# Patient Record
Sex: Male | Born: 1985 | Hispanic: No
Health system: Southern US, Academic
[De-identification: ages and names within clinical notes are randomized; demographics above are authoritative.]

## PROBLEM LIST (undated history)

## (undated) DIAGNOSIS — N200 Calculus of kidney: Secondary | ICD-10-CM

## (undated) HISTORY — DX: Calculus of kidney: N20.0

---

## 2019-12-29 ENCOUNTER — Emergency Department (HOSPITAL_COMMUNITY)
Admission: EM | Admit: 2019-12-29 | Discharge: 2019-12-30 | Disposition: A | Discharge status: Home / Self Care | Payer: PRIVATE HEALTH INSURANCE | Attending: Emergency Medicine | Admitting: Emergency Medicine

## 2019-12-29 ENCOUNTER — Encounter (HOSPITAL_COMMUNITY): Payer: Self-pay | Admitting: Emergency Medicine

## 2019-12-29 ENCOUNTER — Other Ambulatory Visit: Payer: Self-pay

## 2019-12-29 DIAGNOSIS — N201 Calculus of ureter: Secondary | ICD-10-CM | POA: Insufficient documentation

## 2019-12-29 DIAGNOSIS — R1030 Lower abdominal pain, unspecified: Secondary | ICD-10-CM | POA: Diagnosis present

## 2019-12-29 LAB — URINALYSIS, ROUTINE W REFLEX MICROSCOPIC
Bilirubin Urine: NEGATIVE
Glucose, UA: NEGATIVE mg/dL
Ketones, ur: NEGATIVE mg/dL
Leukocytes,Ua: NEGATIVE
Nitrite: NEGATIVE
Protein, ur: NEGATIVE mg/dL
Specific Gravity, Urine: 1.023 (ref 1.005–1.030)
pH: 5 (ref 5.0–8.0)

## 2019-12-29 MED ORDER — HYDROMORPHONE HCL 1 MG/ML IJ SOLN
1.0000 mg | Freq: Once | INTRAMUSCULAR | Status: AC
  Administered 2019-12-30: 1 mg via INTRAVENOUS
  Filled 2019-12-29: qty 1

## 2019-12-29 MED ORDER — ONDANSETRON HCL 4 MG/2ML IJ SOLN
4.0000 mg | Freq: Once | INTRAMUSCULAR | Status: AC
  Administered 2019-12-30: 4 mg via INTRAVENOUS
  Filled 2019-12-29: qty 2

## 2019-12-29 MED ORDER — KETOROLAC TROMETHAMINE 30 MG/ML IJ SOLN
30.0000 mg | Freq: Once | INTRAMUSCULAR | Status: AC
  Administered 2019-12-30: 30 mg via INTRAVENOUS
  Filled 2019-12-29: qty 1

## 2019-12-29 NOTE — ED Triage Notes (Signed)
Pt reports left sided flank pain with hx of kidney stones. Pt reports hematuria.

## 2019-12-29 NOTE — ED Provider Notes (Signed)
[facility]-EMERGENCY DEPT Provider Note   CSN: [id number] Arrival date & time: 12/29/19  2239     History Chief Complaint  Patient presents with  . Flank Pain    Jamie Chase is a 34 y.o. male.  Patient presents to the emergency department with a chief complaint of right-sided flank pain.  He states the pain started this morning.  He states that it feels similar to prior kidney stones.  He rates his pain as a 10 out of 10.  He reports associated hematuria and dysuria.  He has tried taking tramadol with no relief of his symptoms.  He reports having had 10 lithotripsies in the past.  He reports associated nausea, but has not had vomiting.  He denies any other associated symptoms.  The history is provided by the patient. No language interpreter was used.       Past Medical History:  Diagnosis Date  . Kidney stones     There are no problems to display for this patient.   History reviewed. No pertinent surgical history.     No family history on file.  Social History   Tobacco Use  . Smoking status: Never Smoker  . Smokeless tobacco: Never Used  Substance Use Topics  . Alcohol use: Never  . Drug use: Yes    Types: Marijuana    Home Medications Prior to Admission medications   Not on File    Allergies    Patient has no known allergies.  Review of Systems   Review of Systems  All other systems reviewed and are negative.   Physical Exam Updated Vital Signs BP (!) 157/114 (BP Location: Right Arm)   Pulse 95   Temp 97.9 F (36.6 C) (Oral)   Resp 18   Ht 6\' 1"  (1.854 m)   Wt (!) 190.1 kg   SpO2 96%   BMI 55.28 kg/m   Physical Exam Vitals and nursing note reviewed.  Constitutional:      Appearance: He is well-developed.     Comments: Morbidly obese  HENT:     Head: Normocephalic and atraumatic.  Eyes:     Conjunctiva/sclera: Conjunctivae normal.  Cardiovascular:     Rate and Rhythm: Normal rate and regular rhythm.      Heart sounds: No murmur heard.   Pulmonary:     Effort: Pulmonary effort is normal. No respiratory distress.     Breath sounds: Normal breath sounds.  Abdominal:     Palpations: Abdomen is soft.     Tenderness: There is no abdominal tenderness.     Comments: No focal abdominal tenderness, no RLQ tenderness or pain at McBurney's point, no RUQ tenderness or Murphy's sign, no left-sided abdominal tenderness, no fluid wave, or signs of peritonitis   Musculoskeletal:        General: Normal range of motion.     Cervical back: Neck supple.  Skin:    General: Skin is warm and dry.  Neurological:     Mental Status: He is alert and oriented to person, place, and time.  Psychiatric:        Mood and Affect: Mood normal.        Behavior: Behavior normal.     ED Results / Procedures / Treatments   Labs (all labs ordered are listed, but only abnormal results are displayed) Labs Reviewed  URINALYSIS, ROUTINE W REFLEX MICROSCOPIC  CBC WITH DIFFERENTIAL/PLATELET  COMPREHENSIVE METABOLIC PANEL    EKG None  Radiology No results found.  Procedures Procedures (including critical care time)  Medications Ordered in ED Medications  ketorolac (TORADOL) 30 MG/ML injection 30 mg (has no administration in time range)  HYDROmorphone (DILAUDID) injection 1 mg (has no administration in time range)  ondansetron (ZOFRAN) injection 4 mg (has no administration in time range)    ED Course  I have reviewed the triage vital signs and the nursing notes.  Pertinent labs & imaging results that were available during my care of the patient were reviewed by me and considered in my medical decision making (see chart for details).    MDM Rules/Calculators/A&P                          Patient with right-sided flank pain.  He has an extensive history of kidney stones.  He states this feels similar.  His symptoms started earlier this morning.  His pain is 10 out of 10.    We will treat pain with Toradol and  Dilaudid.  Laboratory work-up notable for normal creatinine, no leukocytosis.  CT shows 5 mm obstructing renal stone in the mid right ureter with significant right-sided hydronephrosis and hydroureter.  Urinalysis shows moderate hemoglobin, 21-50 RBC and 6-10 WBC with rare bacteria.  I have a lower suspicion for infected stone given patient's normal vitals and reassuring labs.  I will send urine for culture.  Of note, patient is taking amoxicillin for pharyngitis.  We will send Norco to pharmacy.  Patient is scheduled to see his urology provider on Monday.  Return precautions discussed.  Final Clinical Impression(s) / ED Diagnoses Final diagnoses:  Ureterolithiasis    Rx / DC Orders ED Discharge Orders         Ordered    HYDROcodone-acetaminophen (NORCO/VICODIN) 5-325 MG tablet  Every 6 hours PRN     Discontinue  Reprint     12/30/19 0159    ondansetron (ZOFRAN ODT) 4 MG disintegrating tablet  Every 8 hours PRN     Discontinue  Reprint     12/30/19 0159           Roxy Horseman, PA-C 12/30/19 0203    Ward, Layla Maw, DO 12/30/19 0206

## 2019-12-30 ENCOUNTER — Emergency Department (HOSPITAL_COMMUNITY): Payer: PRIVATE HEALTH INSURANCE

## 2019-12-30 DIAGNOSIS — N201 Calculus of ureter: Secondary | ICD-10-CM | POA: Diagnosis not present

## 2019-12-30 LAB — COMPREHENSIVE METABOLIC PANEL
ALT: 32 U/L (ref 0–44)
AST: 32 U/L (ref 15–41)
Albumin: 4.2 g/dL (ref 3.5–5.0)
Alkaline Phosphatase: 54 U/L (ref 38–126)
Anion gap: 11 (ref 5–15)
BUN: 19 mg/dL (ref 6–20)
CO2: 24 mmol/L (ref 22–32)
Calcium: 9.2 mg/dL (ref 8.9–10.3)
Chloride: 107 mmol/L (ref 98–111)
Creatinine, Ser: 1.26 mg/dL — ABNORMAL HIGH (ref 0.61–1.24)
GFR calc Af Amer: >60 mL/min (ref >60)
GFR calc non Af Amer: >60 mL/min (ref >60)
Glucose, Bld: 124 mg/dL — ABNORMAL HIGH (ref 70–99)
Potassium: 4.1 mmol/L (ref 3.5–5.1)
Sodium: 142 mmol/L (ref 135–145)
Total Bilirubin: 0.7 mg/dL (ref 0.3–1.2)
Total Protein: 8.1 g/dL (ref 6.5–8.1)

## 2019-12-30 LAB — CBC WITH DIFFERENTIAL/PLATELET
Abs Immature Granulocytes: 0.03 10*3/uL (ref 0.00–0.07)
Basophils Absolute: 0 10*3/uL (ref 0.0–0.1)
Basophils Relative: 0 %
Eosinophils Absolute: 0 10*3/uL (ref 0.0–0.5)
Eosinophils Relative: 1 %
HCT: 49.6 % (ref 39.0–52.0)
Hemoglobin: 15.9 g/dL (ref 13.0–17.0)
Immature Granulocytes: 0 %
Lymphocytes Relative: 22 %
Lymphs Abs: 1.8 10*3/uL (ref 0.7–4.0)
MCH: 28.9 pg (ref 26.0–34.0)
MCHC: 32.1 g/dL (ref 30.0–36.0)
MCV: 90 fL (ref 80.0–100.0)
Monocytes Absolute: 0.7 10*3/uL (ref 0.1–1.0)
Monocytes Relative: 9 %
Neutro Abs: 5.6 10*3/uL (ref 1.7–7.7)
Neutrophils Relative %: 68 %
Platelets: 319 10*3/uL (ref 150–400)
RBC: 5.51 MIL/uL (ref 4.22–5.81)
RDW: 14.3 % (ref 11.5–15.5)
WBC: 8.2 10*3/uL (ref 4.0–10.5)
nRBC: 0 % (ref 0.0–0.2)

## 2019-12-30 MED ORDER — ONDANSETRON 4 MG PO TBDP
4.0000 mg | ORAL_TABLET | Freq: Three times a day (TID) | ORAL | 0 refills | Status: AC | PRN
Start: 2019-12-30 — End: ?

## 2019-12-30 MED ORDER — HYDROMORPHONE HCL 1 MG/ML IJ SOLN
1.0000 mg | Freq: Once | INTRAMUSCULAR | Status: AC
  Administered 2019-12-30: 1 mg via INTRAVENOUS
  Filled 2019-12-30: qty 1

## 2019-12-30 MED ORDER — HYDROCODONE-ACETAMINOPHEN 5-325 MG PO TABS: 1.0000 | ORAL_TABLET | Freq: Four times a day (QID) | ORAL | 0 refills | Status: DC | PRN

## 2019-12-30 MED ORDER — ONDANSETRON 4 MG PO TBDP
4.0000 mg | ORAL_TABLET | Freq: Three times a day (TID) | ORAL | 0 refills | Status: DC | PRN
Start: 2019-12-30 — End: 2019-12-30

## 2019-12-30 MED ORDER — HYDROCODONE-ACETAMINOPHEN 5-325 MG PO TABS: 1.0000 | ORAL_TABLET | Freq: Four times a day (QID) | ORAL | 0 refills | Status: AC | PRN

## 2019-12-30 NOTE — Discharge Instructions (Addendum)
Continue to drink plenty of fluids.  Please follow-up with your urologist.  Please take pain medication as prescribed.  If you run a fever, or have worsening pain, or uncontrollable vomiting, please return to the emergency department.

## 2021-04-11 ENCOUNTER — Other Ambulatory Visit: Payer: Self-pay

## 2021-04-11 ENCOUNTER — Emergency Department (HOSPITAL_COMMUNITY)
Admission: RE | Admit: 2021-04-11 | Discharge: 2021-04-11 | Disposition: A | Discharge status: Home / Self Care | Payer: Self-pay | Source: Ambulatory Visit

## 2021-04-11 ENCOUNTER — Emergency Department
Admission: EM | Admit: 2021-04-11 | Discharge: 2021-04-12 | Disposition: A | Discharge status: Home / Self Care | Payer: Self-pay | Attending: Nurse Practitioner | Admitting: Nurse Practitioner

## 2021-04-11 DIAGNOSIS — L0390 Cellulitis, unspecified: Secondary | ICD-10-CM | POA: Insufficient documentation

## 2021-04-11 DIAGNOSIS — L03116 Cellulitis of left lower limb: Secondary | ICD-10-CM

## 2021-04-11 DIAGNOSIS — L03115 Cellulitis of right lower limb: Secondary | ICD-10-CM

## 2021-04-11 LAB — CBC WITH DIFF
BASOPHIL #: 0 10*3/uL (ref 0.00–0.20)
BASOPHIL %: 1 % (ref 0–2)
EOSINOPHIL #: 0.1 10*3/uL (ref 0.00–0.60)
EOSINOPHIL %: 3 % (ref 0–5)
HCT: 44.3 % (ref 36.0–46.0)
HGB: 14.7 g/dL (ref 13.9–16.3)
LYMPHOCYTE #: 1.4 10*3/uL (ref 1.10–3.80)
LYMPHOCYTE %: 25 % (ref 19–46)
MCH: 27.5 pg (ref 25.4–34.0)
MCHC: 33.1 g/dL (ref 30.0–37.0)
MCV: 83.1 fL (ref 80.0–100.0)
MONOCYTE #: 0.5 10*3/uL (ref 0.10–0.80)
MONOCYTE %: 9 % (ref 4–12)
MPV: 7.1 fL — ABNORMAL LOW (ref 7.5–11.5)
NEUTROPHIL #: 3.6 10*3/uL (ref 1.80–7.50)
NEUTROPHIL %: 64 % (ref 41–69)
PLATELETS: 310 10*3/uL (ref 130–400)
RBC: 5.33 10*6/uL (ref 4.30–5.90)
RDW: 14.4 % — ABNORMAL HIGH (ref 11.5–14.0)
WBC: 5.6 10*3/uL (ref 4.5–11.5)

## 2021-04-11 LAB — COMPREHENSIVE METABOLIC PANEL, NON-FASTING
ALBUMIN/GLOBULIN RATIO: 1.4 — ABNORMAL LOW (ref 1.5–2.5)
ALBUMIN: 4.2 g/dL (ref 3.5–5.0)
ALKALINE PHOSPHATASE: 63 U/L (ref 38–126)
ALT (SGPT): 22 U/L (ref <50)
ANION GAP: 11 mmol/L (ref 5–19)
AST (SGOT): 29 U/L (ref 17–59)
BILIRUBIN TOTAL: 0.6 mg/dL (ref 0.2–1.3)
BUN/CREA RATIO: 13 (ref 6–20)
BUN: 13 mg/dL (ref 9–20)
CALCIUM: 8.9 mg/dL (ref 8.4–10.2)
CHLORIDE: 105 mmol/L (ref 98–107)
CO2 TOTAL: 25 mmol/L (ref 22–30)
CREATININE: 0.98 mg/dL (ref 0.66–1.20)
ESTIMATED GFR: >60 mL/min/{1.73_m2} (ref >60)
GLUCOSE: 84 mg/dL (ref 74–106)
POTASSIUM: 4 mmol/L (ref 3.5–5.1)
PROTEIN TOTAL: 7.3 g/dL (ref 6.3–8.2)
SODIUM: 141 mmol/L (ref 137–145)

## 2021-04-11 LAB — TROPONIN-I: TROPONIN I: <0.01 ng/mL (ref 0.00–0.03)

## 2021-04-11 LAB — D-DIMER: D-DIMER: 0.19 mg/L (ref <0.53)

## 2021-04-11 LAB — NT-PROBNP: NT-PROBNP: 106 pg/mL

## 2021-04-11 MED ORDER — CEFAZOLIN 1 GRAM/50 ML IN DEXTROSE (ISO-OSMOTIC) INTRAVENOUS PIGGYBACK
1.0000 g | INJECTION | Freq: Three times a day (TID) | INTRAVENOUS | Status: DC
Start: 2021-04-12 — End: 2021-04-11

## 2021-04-11 MED ORDER — CEPHALEXIN 500 MG CAPSULE
500.0000 mg | ORAL_CAPSULE | Freq: Four times a day (QID) | ORAL | 0 refills | Status: AC
Start: 2021-04-11 — End: 2021-04-21

## 2021-04-11 MED ORDER — CEFAZOLIN 1 GRAM/50 ML IN DEXTROSE (ISO-OSMOTIC) INTRAVENOUS PIGGYBACK
1.0000 g | INJECTION | INTRAVENOUS | Status: AC
Start: 2021-04-12 — End: 2021-04-12
  Administered 2021-04-12: 0 g via INTRAVENOUS
  Administered 2021-04-12: 1 g via INTRAVENOUS

## 2021-04-11 NOTE — ED Provider Notes (Signed)
[facility]  Emergency Department      Name: Nathan Johns  Age and Gender: 35 y.o. male  Date of Birth: 02/08/1986  Date of Service: 04/11/2021   MRN: [id number]  PCP: No primary care provider on file.    Chief Complaint   Patient presents with   . Leg Swelling     BLE redness and swelling       HPI:  Patient presents to the emergency department chief complaint of bilateral lower leg swelling and redness.  Patient states he noticed this earlier today.  Patient's feels as if his legs are warm to touch and are definitely painful.  Patient denies any shortness of breath.  Patient denies any chest pain.  Patient denies any activity intolerance.    Nathan Johns is a 35 y.o. male presenting with bilateral lower leg redness and swelling    ROS:  All systems reviewed and are negative, unless stated in the HPI.      Below pertinent information reviewed with patient and/or EMR:  No past medical history on file.   Cannot display prior to admission medications because the patient has not been admitted in this contact.        No Known Allergies  No past surgical history on file.  Family Medical History:    None            Objective:  ED Triage Vitals [04/11/21 2228]   BP (Non-Invasive) 125/82   Heart Rate 96   Respiratory Rate 18   Temperature 36.8 C (98.2 F)   SpO2 100 %   Weight (!) 166 kg (365 lb)   Height 1.854 m ('6\' 1"'$ )     Nursing notes and vital signs reviewed.    Constitutional:  No acute distress.  Alert and oriented to person, place, and time.   HENT:   Head: Normocephalic and atraumatic.   Mouth/Throat: Oropharynx is clear and moist.   Eyes: EOMI, PERRL   Ears: TM's normal, ear canal normal  Neck: Trachea midline. Neck supple.  Cardiovascular: RRR, No murmurs, rubs or gallops. Intact distal pulses.  Pulmonary/Chest: BS equal bilaterally. No respiratory distress. No wheezes, rales or chest tenderness.  No tachypnea, retractions or accessory muscle use.  Abdominal: BS +. Abdomen soft, no tenderness, rebound  or guarding.  Back: No midline spinal tenderness, no paraspinal tenderness, no CVA tenderness.     Musculoskeletal:   Strength equal all extremities.  Extremities N/V intact  Skin: warm and dry.  There is erythema and warmth to touch of the bilateral lower legs from mid tibia down to ankles.  There is also bilateral edema noted but no pitting edema.  Psychiatric: normal mood and affect. Behavior is normal.   Neurological: Patient keenly alert and responsive, CN II-XII grossly intact, moving all extremities equally and fully, normal gait    Labs:   Labs Reviewed   COMPREHENSIVE METABOLIC PANEL, NON-FASTING - Abnormal; Notable for the following components:       Result Value    ALBUMIN/GLOBULIN RATIO 1.4 (*)     All other components within normal limits    Narrative:     Estimated Glomerular Filtration Rate (eGFR) is calculated using the CKD-EPI (2021) equation, intended for patients 91 years of age and older. If gender is not documented or "unknown", there will be no eGFR calculation.   CBC WITH DIFF - Abnormal; Notable for the following components:    RDW 14.4 (*)     MPV 7.1 (*)  All other components within normal limits   D-DIMER - Normal   TROPONIN-I - Normal   CBC/DIFF    Narrative:     The following orders were created for panel order CBC/DIFF.  Procedure                               Abnormality         Status                     ---------                               -----------         ------                     CBC WITH DDUK[[id number]]                Abnormal            Final result                 Please view results for these tests on the individual orders.   NT-PROBNP       Imaging:  XR AP MOBILE CHEST   Final Result by Edi, Radresults In (10/29 2300)   NO ACUTE FINDINGS.         Radiologist location ID: [id number]             EKG:  EKG shows a sinus rhythm of 93.  PR interval is 124.  QT is 354.  The rhythm is regular there is normal ST segment.  There is no ectopic beats.  EKG is reviewed by ED  attending.    MDM/Course:  Patient remained stable the entire time in the emergency department.  Patient's chest x-ray was negative per the radiologist interpretation.  Patient is troponin D-dimer and BNP were also within normal limits.  Patient's EKG is unremarkable.  The patient will be given 1 g of Ancef IV followed by Keflex.  Patient advised to follow up family doctor this week for further evaluation return if symptoms worse or change in where a new symptoms develop.  Patient verbalized understanding agree with treatment plan.           Clinical Impression:     Encounter Diagnosis   Name Primary?   . Cellulitis, unspecified cellulitis site Yes       Medications given:  Medications Administered in the ED   ceFAZolin (ANCEF) 1 g in iso-osmotic 50 mL premix IVPB (has no administration in time range)           Disposition: Discharged       Current Discharge Medication List      START taking these medications.      Details   cephalexin 500 mg Capsule  Commonly known as: KEFLEX   500 mg, Oral, 4 TIMES DAILY  Qty: 40 Capsule  Refills: 0            Follow up:      No follow-up provider specified.    Parts of this patients chart were completed in a retrospective fashion due to simultaneous direct patient care activities in the Emergency Department.   This note was partially generated using MModal Fluency Direct system, and there may be some incorrect words, spellings, and punctuation that were not noted  in checking the note before saving.

## 2021-04-12 LAB — ECG 12 LEAD
Calculated P Axis: 24 degrees
Calculated R Axis: 49 degrees
Calculated T Axis: 38 degrees
PR Interval: 124 ms
QRS Duration: 92 ms
QT Interval: 352 ms
QTC Calculation: 402 ms
Ventricular rate: 93 {beats}/min

## 2021-04-12 MED ORDER — CEFAZOLIN 1 GRAM/50 ML IN DEXTROSE (ISO-OSMOTIC) INTRAVENOUS PIGGYBACK
INJECTION | INTRAVENOUS | Status: AC
Start: 2021-04-12 — End: 2021-04-12
  Filled 2021-04-12: qty 50

## 2021-04-12 NOTE — ED Nurses Note (Signed)
Patient discharged home with family.  AVS reviewed with patient/care giver.  A written copy of the AVS and discharge instructions was given to the patient/care giver.  Questions sufficiently answered as needed.  Patient/care giver encouraged to follow up with PCP as indicated.  In the event of an emergency, patient/care giver instructed to call 911 or go to the nearest emergency room.

## 2021-09-06 IMAGING — CT CT RENAL STONE PROTOCOL
2 of 4 series · 16 of 46 positions shown, 18 images · non-contrast
Comparison: None.

CLINICAL DATA: Right flank pain.

EXAM:
CT ABDOMEN AND PELVIS WITHOUT CONTRAST
TECHNIQUE: Multidetector CT imaging of the abdomen and pelvis was performed
following the standard protocol without IV contrast.

[Series 2: axial st · axial · 0.88mm/px · z∈[-556,-56]mm · 13 of 116 slices shown, 15 images]
[im 8/116  soft-tissue]
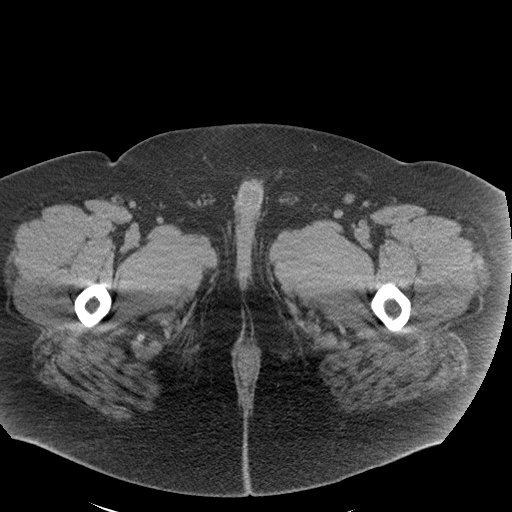
[im 8/116  bone]
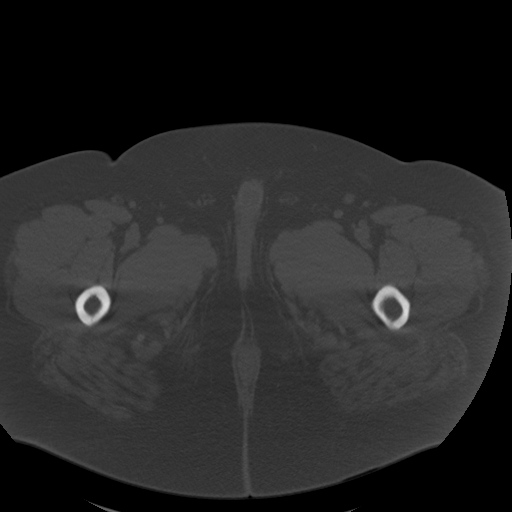
[im 16/116  soft-tissue]
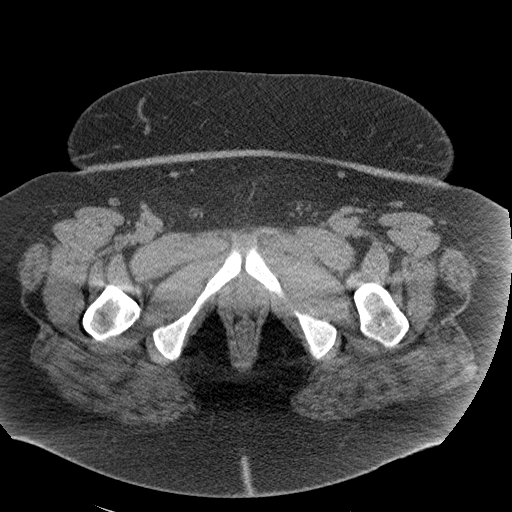
[im 24/116  soft-tissue]
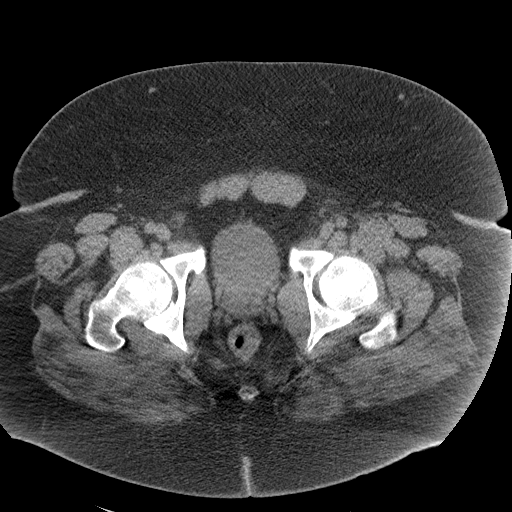
[im 31/116  soft-tissue]
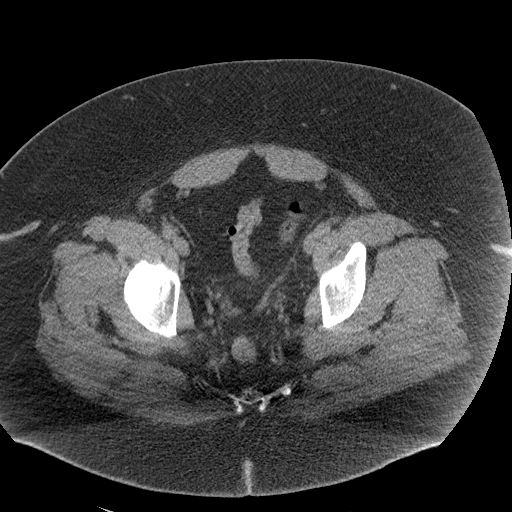
[im 39/116  soft-tissue]
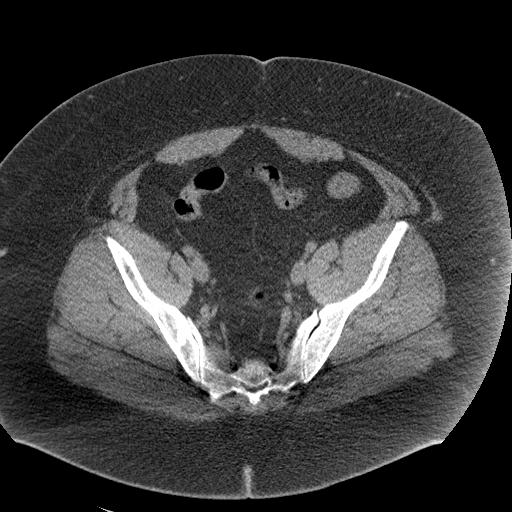
[im 47/116  soft-tissue]
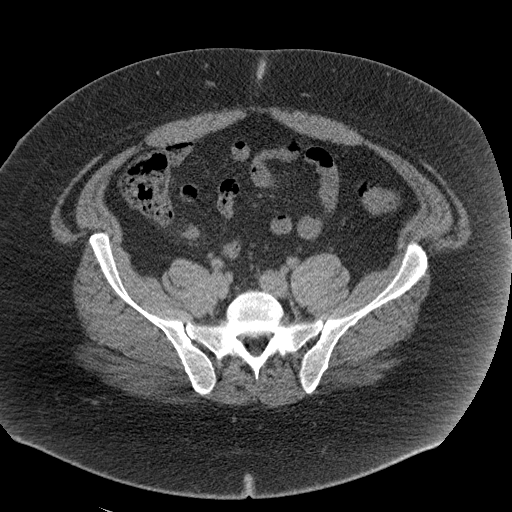
[im 62/116  soft-tissue]
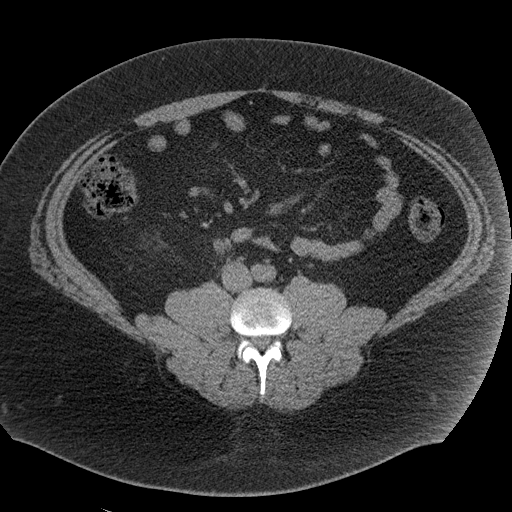
[im 70/116  soft-tissue]
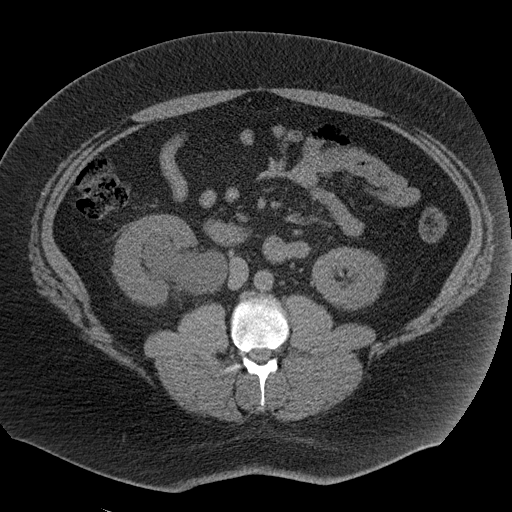
[im 77/116  soft-tissue]
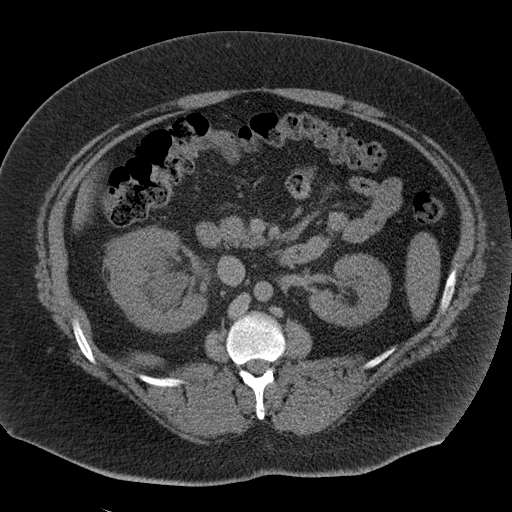
[im 77/116  bone]
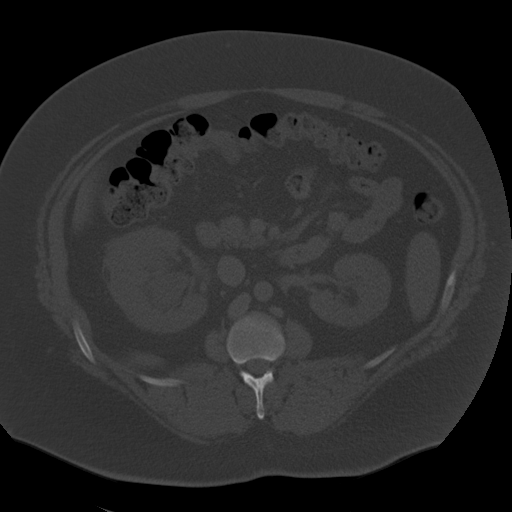
[im 85/116  soft-tissue]
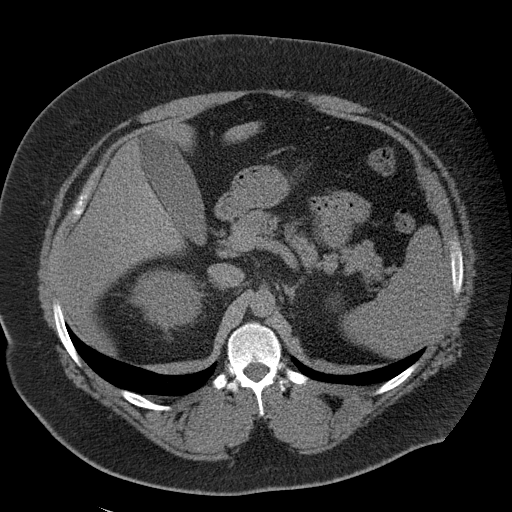
[im 93/116  soft-tissue]
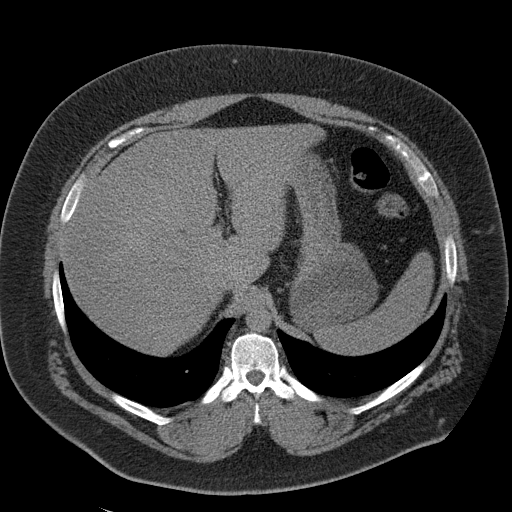
[im 100/116  soft-tissue]
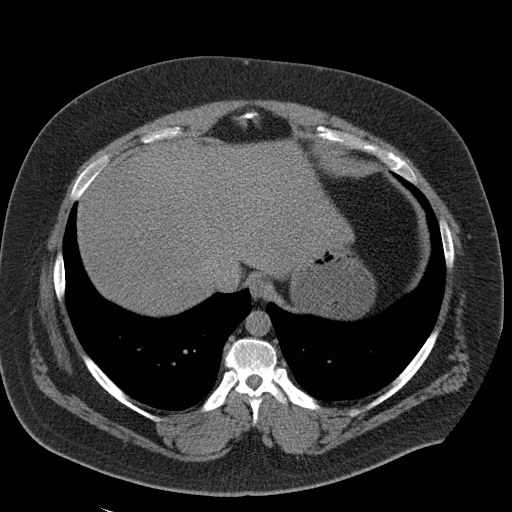
[im 108/116  soft-tissue]
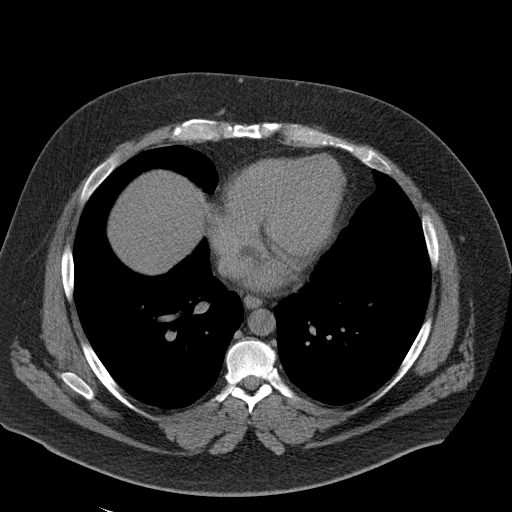

[Series 5: coronal · coronal · 1.06mm/px · 3 of 222 slices shown]
[im 74/222  soft-tissue]
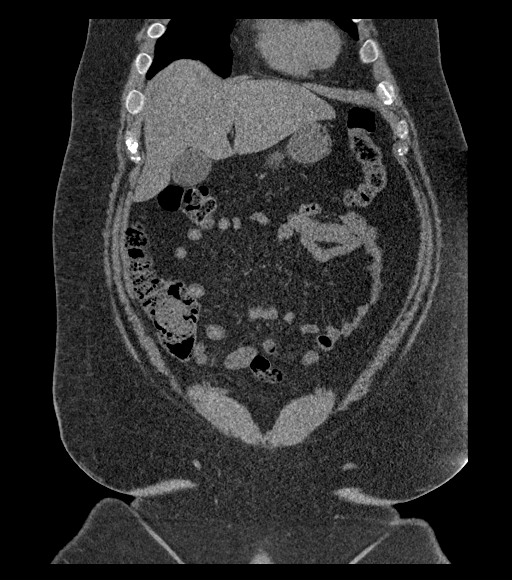
[im 99/222  soft-tissue]
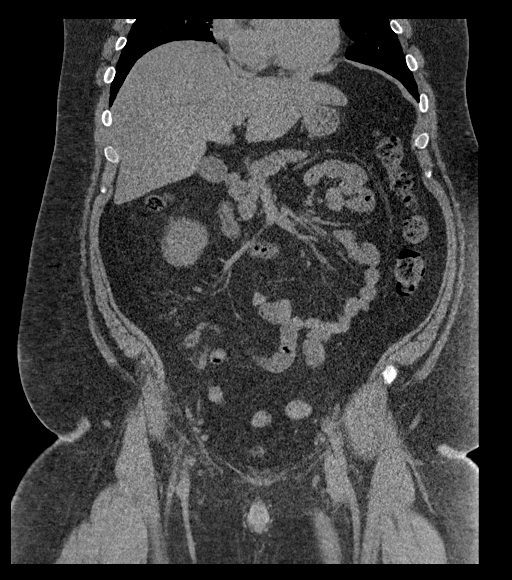
[im 123/222  soft-tissue]
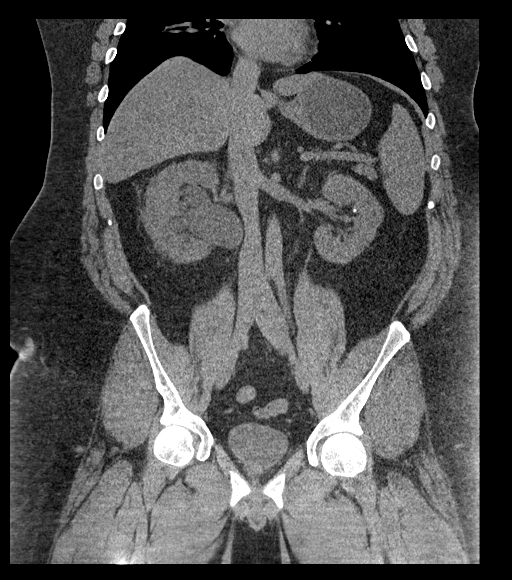

[16 of 46 positions shown; findings below may reference images not displayed]

FINDINGS: Lower chest: No acute abnormality.

Hepatobiliary: No focal liver abnormality is seen. No gallstones,
gallbladder wall thickening, or biliary dilatation.

Pancreas: Unremarkable. No pancreatic ductal dilatation or
surrounding inflammatory changes.

Spleen: Normal in size without focal abnormality.

Adrenals/Urinary Tract: Adrenal glands are unremarkable. Kidneys are
normal in size. A 5 mm obstructing renal stone is seen within the
mid right ureter with marked severity right-sided hydronephrosis and
hydroureter a 6 mm nonobstructing renal stone is seen within the
dependent portion of the right renal pelvis. A 2 mm nonobstructing
renal stone is noted within the mid left kidney. Bladder is
unremarkable.

Stomach/Bowel: Stomach is within normal limits. Appendix appears
normal. No evidence of bowel wall thickening, distention, or
inflammatory changes.

Vascular/Lymphatic: No significant vascular findings are present. No
enlarged abdominal or pelvic lymph nodes.

Reproductive: The prostate gland is normal in size. A mild amount of
prostate gland calcification is seen.

Other: No abdominal wall hernia or abnormality. No abdominopelvic
ascites.

Musculoskeletal: No acute or significant osseous findings.
IMPRESSION: 1. 5 mm obstructing renal stone within the mid right ureter with
marked severity right-sided hydronephrosis and hydroureter.
2. Bilateral nonobstructing renal calculi.
